# Patient Record
Sex: Male | Born: 1935 | Race: White | Hispanic: No | Marital: Married
Health system: Southern US, Community
[De-identification: ages and names within clinical notes are randomized; demographics above are authoritative.]

---

## 2019-06-26 ENCOUNTER — Other Ambulatory Visit (HOSPITAL_COMMUNITY): Payer: Medicare Other

## 2019-06-26 ENCOUNTER — Inpatient Hospital Stay
Admission: RE | Admit: 2019-06-26 | Discharge: 2019-07-27 | Disposition: A | Discharge status: Skilled Nursing Facility | Payer: Medicare Other | Attending: Internal Medicine | Admitting: Internal Medicine

## 2019-06-26 DIAGNOSIS — T85598A Other mechanical complication of other gastrointestinal prosthetic devices, implants and grafts, initial encounter: Secondary | ICD-10-CM

## 2019-06-26 DIAGNOSIS — Z931 Gastrostomy status: Secondary | ICD-10-CM

## 2019-06-26 DIAGNOSIS — Z4659 Encounter for fitting and adjustment of other gastrointestinal appliance and device: Secondary | ICD-10-CM

## 2019-06-26 DIAGNOSIS — Z431 Encounter for attention to gastrostomy: Secondary | ICD-10-CM

## 2019-06-27 LAB — CBC WITH DIFFERENTIAL/PLATELET
Abs Immature Granulocytes: 0.06 10*3/uL (ref 0.00–0.07)
Basophils Absolute: 0.1 10*3/uL (ref 0.0–0.1)
Basophils Relative: 1 %
Eosinophils Absolute: 0.3 10*3/uL (ref 0.0–0.5)
Eosinophils Relative: 3 %
HCT: 30.7 % — ABNORMAL LOW (ref 39.0–52.0)
Hemoglobin: 10.1 g/dL — ABNORMAL LOW (ref 13.0–17.0)
Immature Granulocytes: 1 %
Lymphocytes Relative: 18 %
Lymphs Abs: 1.4 10*3/uL (ref 0.7–4.0)
MCH: 29.5 pg (ref 26.0–34.0)
MCHC: 32.9 g/dL (ref 30.0–36.0)
MCV: 89.8 fL (ref 80.0–100.0)
Monocytes Absolute: 0.7 10*3/uL (ref 0.1–1.0)
Monocytes Relative: 9 %
Neutro Abs: 5.5 10*3/uL (ref 1.7–7.7)
Neutrophils Relative %: 68 %
Platelets: 428 10*3/uL — ABNORMAL HIGH (ref 150–400)
RBC: 3.42 MIL/uL — ABNORMAL LOW (ref 4.22–5.81)
RDW: 14.3 % (ref 11.5–15.5)
WBC: 8 10*3/uL (ref 4.0–10.5)
nRBC: 0 % (ref 0.0–0.2)

## 2019-06-27 LAB — COMPREHENSIVE METABOLIC PANEL
ALT: 65 U/L — ABNORMAL HIGH (ref 0–44)
AST: 28 U/L (ref 15–41)
Albumin: 2.6 g/dL — ABNORMAL LOW (ref 3.5–5.0)
Alkaline Phosphatase: 114 U/L (ref 38–126)
Anion gap: 8 (ref 5–15)
BUN: 22 mg/dL (ref 8–23)
CO2: 27 mmol/L (ref 22–32)
Calcium: 8.7 mg/dL — ABNORMAL LOW (ref 8.9–10.3)
Chloride: 100 mmol/L (ref 98–111)
Creatinine, Ser: 0.85 mg/dL (ref 0.61–1.24)
GFR calc Af Amer: >60 mL/min (ref >60)
GFR calc non Af Amer: >60 mL/min (ref >60)
Glucose, Bld: 180 mg/dL — ABNORMAL HIGH (ref 70–99)
Potassium: 4 mmol/L (ref 3.5–5.1)
Sodium: 135 mmol/L (ref 135–145)
Total Bilirubin: 0.5 mg/dL (ref 0.3–1.2)
Total Protein: 5.7 g/dL — ABNORMAL LOW (ref 6.5–8.1)

## 2019-06-27 LAB — PROTIME-INR
INR: 1.1 (ref 0.8–1.2)
Prothrombin Time: 14 seconds (ref 11.4–15.2)

## 2019-06-28 MED ORDER — FLECAINIDE ACETATE 50 MG PO TABS
50.00 | ORAL_TABLET | ORAL | Status: DC
Start: 2019-06-27 — End: 2019-06-28

## 2019-06-28 MED ORDER — METOPROLOL TARTRATE 25 MG PO TABS
25.00 | ORAL_TABLET | ORAL | Status: DC
Start: 2019-06-27 — End: 2019-06-28

## 2019-06-28 MED ORDER — AMLODIPINE BESYLATE 5 MG PO TABS
5.00 | ORAL_TABLET | ORAL | Status: DC
Start: 2019-06-27 — End: 2019-06-28

## 2019-06-28 MED ORDER — GENERIC EXTERNAL MEDICATION
Status: DC
Start: ? — End: 2019-06-28

## 2019-06-28 MED ORDER — ACETAMINOPHEN 500 MG PO TABS
1000.00 | ORAL_TABLET | ORAL | Status: DC
Start: ? — End: 2019-06-28

## 2019-06-28 MED ORDER — SODIUM CHLORIDE 1 G PO TABS
1000.00 | ORAL_TABLET | ORAL | Status: DC
Start: 2019-06-27 — End: 2019-06-28

## 2019-06-28 MED ORDER — MIRTAZAPINE 15 MG PO TABS
15.00 | ORAL_TABLET | ORAL | Status: DC
Start: 2019-06-27 — End: 2019-06-28

## 2019-06-28 MED ORDER — MAGNESIUM OXIDE 400 MG PO TABS
400.00 | ORAL_TABLET | ORAL | Status: DC
Start: 2019-06-27 — End: 2019-06-28

## 2019-06-28 MED ORDER — PRAVASTATIN SODIUM 40 MG PO TABS
40.00 | ORAL_TABLET | ORAL | Status: DC
Start: 2019-06-27 — End: 2019-06-28

## 2019-06-28 MED ORDER — FA-PYRIDOXINE-CYANOCOBALAMIN 2.5-25-2 MG PO TABS
1.00 | ORAL_TABLET | ORAL | Status: DC
Start: 2019-06-27 — End: 2019-06-28

## 2019-06-28 MED ORDER — HYDROXYCHLOROQUINE SULFATE 200 MG PO TABS
200.00 | ORAL_TABLET | ORAL | Status: DC
Start: 2019-06-27 — End: 2019-06-28

## 2019-06-28 MED ORDER — VENLAFAXINE HCL 37.5 MG PO TABS
37.50 | ORAL_TABLET | ORAL | Status: DC
Start: 2019-06-27 — End: 2019-06-28

## 2019-06-28 MED ORDER — HYDRALAZINE HCL 20 MG/ML IJ SOLN
10.00 | INTRAMUSCULAR | Status: DC
Start: ? — End: 2019-06-28

## 2019-06-28 MED ORDER — POLYSACCHARIDE IRON COMPLEX 150 MG PO CAPS
150.00 | ORAL_CAPSULE | ORAL | Status: DC
Start: 2019-06-27 — End: 2019-06-28

## 2019-06-28 MED ORDER — GLUCOSE 40 % PO GEL
15.00 | ORAL | Status: DC
Start: ? — End: 2019-06-28

## 2019-06-28 MED ORDER — INSULIN LISPRO 100 UNIT/ML ~~LOC~~ SOLN
3.00 | SUBCUTANEOUS | Status: DC
Start: 2019-06-27 — End: 2019-06-28

## 2019-06-28 MED ORDER — TERAZOSIN HCL 1 MG PO CAPS
1.00 | ORAL_CAPSULE | ORAL | Status: DC
Start: 2019-06-27 — End: 2019-06-28

## 2019-06-28 MED ORDER — LEVETIRACETAM 500 MG PO TABS
1500.00 | ORAL_TABLET | ORAL | Status: DC
Start: 2019-06-27 — End: 2019-06-28

## 2019-06-28 MED ORDER — INSULIN GLARGINE 100 UNIT/ML SOLOSTAR PEN
10.00 | PEN_INJECTOR | SUBCUTANEOUS | Status: DC
Start: 2019-06-27 — End: 2019-06-28

## 2019-06-28 MED ORDER — GABAPENTIN 100 MG PO CAPS
100.00 | ORAL_CAPSULE | ORAL | Status: DC
Start: 2019-06-27 — End: 2019-06-28

## 2019-06-28 MED ORDER — DEXTROSE 10 % IV SOLN
125.00 | INTRAVENOUS | Status: DC
Start: ? — End: 2019-06-28

## 2019-06-30 ENCOUNTER — Other Ambulatory Visit (HOSPITAL_COMMUNITY): Payer: Medicare Other

## 2019-06-30 LAB — CBC
HCT: 27.6 % — ABNORMAL LOW (ref 39.0–52.0)
Hemoglobin: 8.8 g/dL — ABNORMAL LOW (ref 13.0–17.0)
MCH: 29 pg (ref 26.0–34.0)
MCHC: 31.9 g/dL (ref 30.0–36.0)
MCV: 91.1 fL (ref 80.0–100.0)
Platelets: 358 10*3/uL (ref 150–400)
RBC: 3.03 MIL/uL — ABNORMAL LOW (ref 4.22–5.81)
RDW: 14.5 % (ref 11.5–15.5)
WBC: 7.6 10*3/uL (ref 4.0–10.5)
nRBC: 0 % (ref 0.0–0.2)

## 2019-07-01 LAB — URINALYSIS, ROUTINE W REFLEX MICROSCOPIC
Bacteria, UA: NONE SEEN
Bilirubin Urine: NEGATIVE
Glucose, UA: NEGATIVE mg/dL
Ketones, ur: NEGATIVE mg/dL
Nitrite: NEGATIVE
Protein, ur: 30 mg/dL — AB
RBC / HPF: >50 RBC/hpf — ABNORMAL HIGH (ref 0–5)
Specific Gravity, Urine: 1.018 (ref 1.005–1.030)
pH: 8 (ref 5.0–8.0)

## 2019-07-02 LAB — CBC
HCT: 30.2 % — ABNORMAL LOW (ref 39.0–52.0)
Hemoglobin: 9.4 g/dL — ABNORMAL LOW (ref 13.0–17.0)
MCH: 28.4 pg (ref 26.0–34.0)
MCHC: 31.1 g/dL (ref 30.0–36.0)
MCV: 91.2 fL (ref 80.0–100.0)
Platelets: 317 10*3/uL (ref 150–400)
RBC: 3.31 MIL/uL — ABNORMAL LOW (ref 4.22–5.81)
RDW: 14.1 % (ref 11.5–15.5)
WBC: 7.2 10*3/uL (ref 4.0–10.5)
nRBC: 0 % (ref 0.0–0.2)

## 2019-07-02 LAB — BASIC METABOLIC PANEL
Anion gap: 7 (ref 5–15)
BUN: 22 mg/dL (ref 8–23)
CO2: 31 mmol/L (ref 22–32)
Calcium: 8.8 mg/dL — ABNORMAL LOW (ref 8.9–10.3)
Chloride: 102 mmol/L (ref 98–111)
Creatinine, Ser: 0.79 mg/dL (ref 0.61–1.24)
GFR calc Af Amer: >60 mL/min (ref >60)
GFR calc non Af Amer: >60 mL/min (ref >60)
Glucose, Bld: 165 mg/dL — ABNORMAL HIGH (ref 70–99)
Potassium: 4 mmol/L (ref 3.5–5.1)
Sodium: 140 mmol/L (ref 135–145)

## 2019-07-03 LAB — URINE CULTURE: Culture: >=100000 — AB

## 2019-07-03 LAB — LEVETIRACETAM LEVEL: Levetiracetam Lvl: 24.8 ug/mL (ref 10.0–40.0)

## 2019-07-07 ENCOUNTER — Other Ambulatory Visit (HOSPITAL_COMMUNITY): Payer: Medicare Other

## 2019-07-07 MED ORDER — GENERIC EXTERNAL MEDICATION
Status: DC
Start: ? — End: 2019-07-07

## 2019-07-09 LAB — BASIC METABOLIC PANEL
Anion gap: 9 (ref 5–15)
BUN: 30 mg/dL — ABNORMAL HIGH (ref 8–23)
CO2: 29 mmol/L (ref 22–32)
Calcium: 9.2 mg/dL (ref 8.9–10.3)
Chloride: 110 mmol/L (ref 98–111)
Creatinine, Ser: 0.87 mg/dL (ref 0.61–1.24)
GFR calc Af Amer: >60 mL/min (ref >60)
GFR calc non Af Amer: >60 mL/min (ref >60)
Glucose, Bld: 175 mg/dL — ABNORMAL HIGH (ref 70–99)
Potassium: 3.8 mmol/L (ref 3.5–5.1)
Sodium: 148 mmol/L — ABNORMAL HIGH (ref 135–145)

## 2019-07-09 LAB — CBC
HCT: 31.1 % — ABNORMAL LOW (ref 39.0–52.0)
Hemoglobin: 9.4 g/dL — ABNORMAL LOW (ref 13.0–17.0)
MCH: 28.1 pg (ref 26.0–34.0)
MCHC: 30.2 g/dL (ref 30.0–36.0)
MCV: 93.1 fL (ref 80.0–100.0)
Platelets: 220 10*3/uL (ref 150–400)
RBC: 3.34 MIL/uL — ABNORMAL LOW (ref 4.22–5.81)
RDW: 14.2 % (ref 11.5–15.5)
WBC: 7.7 10*3/uL (ref 4.0–10.5)
nRBC: 0 % (ref 0.0–0.2)

## 2019-07-10 LAB — BASIC METABOLIC PANEL
Anion gap: 9 (ref 5–15)
BUN: 30 mg/dL — ABNORMAL HIGH (ref 8–23)
CO2: 28 mmol/L (ref 22–32)
Calcium: 8.9 mg/dL (ref 8.9–10.3)
Chloride: 107 mmol/L (ref 98–111)
Creatinine, Ser: 0.82 mg/dL (ref 0.61–1.24)
GFR calc Af Amer: >60 mL/min (ref >60)
GFR calc non Af Amer: >60 mL/min (ref >60)
Glucose, Bld: 159 mg/dL — ABNORMAL HIGH (ref 70–99)
Potassium: 3.9 mmol/L (ref 3.5–5.1)
Sodium: 144 mmol/L (ref 135–145)

## 2019-07-12 ENCOUNTER — Other Ambulatory Visit (HOSPITAL_COMMUNITY): Payer: Medicare Other

## 2019-07-12 LAB — BASIC METABOLIC PANEL
Anion gap: 9 (ref 5–15)
BUN: 22 mg/dL (ref 8–23)
CO2: 27 mmol/L (ref 22–32)
Calcium: 8.8 mg/dL — ABNORMAL LOW (ref 8.9–10.3)
Chloride: 105 mmol/L (ref 98–111)
Creatinine, Ser: 0.82 mg/dL (ref 0.61–1.24)
GFR calc Af Amer: >60 mL/min (ref >60)
GFR calc non Af Amer: >60 mL/min (ref >60)
Glucose, Bld: 109 mg/dL — ABNORMAL HIGH (ref 70–99)
Potassium: 3.4 mmol/L — ABNORMAL LOW (ref 3.5–5.1)
Sodium: 141 mmol/L (ref 135–145)

## 2019-07-12 LAB — MAGNESIUM: Magnesium: 1.9 mg/dL (ref 1.7–2.4)

## 2019-07-12 LAB — CBC
HCT: 27.3 % — ABNORMAL LOW (ref 39.0–52.0)
Hemoglobin: 8.5 g/dL — ABNORMAL LOW (ref 13.0–17.0)
MCH: 28.1 pg (ref 26.0–34.0)
MCHC: 31.1 g/dL (ref 30.0–36.0)
MCV: 90.4 fL (ref 80.0–100.0)
Platelets: 216 10*3/uL (ref 150–400)
RBC: 3.02 MIL/uL — ABNORMAL LOW (ref 4.22–5.81)
RDW: 14 % (ref 11.5–15.5)
WBC: 6.7 10*3/uL (ref 4.0–10.5)
nRBC: 0 % (ref 0.0–0.2)

## 2019-07-13 LAB — LEVETIRACETAM LEVEL: Levetiracetam Lvl: 45.2 ug/mL — ABNORMAL HIGH (ref 10.0–40.0)

## 2019-07-14 ENCOUNTER — Other Ambulatory Visit (HOSPITAL_COMMUNITY): Payer: Medicare Other

## 2019-07-14 LAB — POTASSIUM: Potassium: 3 mmol/L — ABNORMAL LOW (ref 3.5–5.1)

## 2019-07-14 MED ORDER — GENERIC EXTERNAL MEDICATION
Status: DC
Start: ? — End: 2019-07-14

## 2019-07-15 LAB — POTASSIUM: Potassium: 3.7 mmol/L (ref 3.5–5.1)

## 2019-07-17 LAB — SARS CORONAVIRUS 2 (TAT 6-24 HRS): SARS Coronavirus 2: NEGATIVE

## 2019-07-17 NOTE — Consult Note (Signed)
Chief Complaint: Patient was seen in consultation today for percutaneous gastric tube placement at the request of Dr Theora Gianotti   Supervising Physician: Ruel Favors  Patient Status: Select  History of Present Illness: Randy Wall. is a 85 y.o. male   CAD Afib-- was on coumadin  CVA SDH/Craniotomy--- Encephalopathy Dysphagia Protein calorie malnutrition Deconditioning Long term care  Request for percutaneous gastric tube placement  No past medical history on file.    Allergies: Patient has no allergy information on record.  Medications: Prior to Admission medications   Not on File     No family history on file.  Social History   Socioeconomic History   Marital status: Married    Spouse name: Not on file   Number of children: Not on file   Years of education: Not on file   Highest education level: Not on file  Occupational History   Not on file  Social Needs   Financial resource strain: Not on file   Food insecurity    Worry: Not on file    Inability: Not on file   Transportation needs    Medical: Not on file    Non-medical: Not on file  Tobacco Use   Smoking status: Not on file  Substance and Sexual Activity   Alcohol use: Not on file   Drug use: Not on file   Sexual activity: Not on file  Lifestyle   Physical activity    Days per week: Not on file    Minutes per session: Not on file   Stress: Not on file  Relationships   Social connections    Talks on phone: Not on file    Gets together: Not on file    Attends religious service: Not on file    Active member of club or organization: Not on file    Attends meetings of clubs or organizations: Not on file    Relationship status: Not on file  Other Topics Concern   Not on file  Social History Narrative   Not on file    Review of Systems: A 12 point ROS discussed and pertinent positives are indicated in the HPI above.  All other systems are  negative.   Vital Signs: There were no vitals taken for this visit.  Physical Exam Vitals signs reviewed.  Cardiovascular:     Rate and Rhythm: Normal rate. Rhythm irregular.  Pulmonary:     Breath sounds: Normal breath sounds.  Abdominal:     Palpations: Abdomen is soft.  Musculoskeletal:     Comments: Tries to communicate Can speak softly Answers some questions  Skin:    General: Skin is dry.  Neurological:     Comments: Can follow few commands  Psychiatric:     Comments: Daughter Romona via phone-- consented for procedure     Imaging: Ct Abdomen Wo Contrast  Result Date: 07/14/2019 CLINICAL DATA:  Preop planning for gastrostomy catheter EXAM: CT ABDOMEN WITHOUT CONTRAST TECHNIQUE: Multidetector CT imaging of the abdomen was performed following the standard protocol without IV contrast. COMPARISON:  None. FINDINGS: Lower chest: Minimal dependent atelectasis posteriorly at the lung bases. Extensive coronary calcifications. No significant pleural or pericardial effusion. Hepatobiliary: No focal liver abnormality is seen. No gallstones, gallbladder wall thickening, or biliary dilatation. Pancreas: Unremarkable. No pancreatic ductal dilatation or surrounding inflammatory changes. Spleen: Normal in size without focal abnormality. Adrenals/Urinary Tract: 1.8 cm nonspecific left adrenal nodule. 0.5 cm parenchymal calcification in the mid left kidney. No  hydronephrosis or urolithiasis. Stomach/Bowel: Stomach is nondistended. There is probable safe epigastric window for percutaneous gastrostomy catheter with gastric distention. Visualized portions of small bowel and colon are nondilated, unremarkable. Vascular/Lymphatic: Aortic Atherosclerosis (ICD10-170.0) without aneurysm. No abdominal or mesenteric adenopathy. Other: No ascites. No free air. Musculoskeletal: Anterior vertebral endplate spurring at multiple levels in the lower thoracic spine. Multilevel spondylitic change in the lumbar  spine. No fracture or worrisome bone lesion. IMPRESSION: 1. Probable safe epigastric window for percutaneous gastrostomy catheter placement with gastric distention. 2. 1.8 cm nonspecific left adrenal nodule, statistically most likely adenoma in the absence of a history of primary carcinoma. 3. Coronary calcifications. The severity of coronary artery disease and any potential stenosis cannot be assessed on this non-gated CT examination. Assessment for potential risk factor modification, dietary therapy or pharmacologic therapy may be warranted, if clinically indicated. Electronically Signed   By: Corlis Leak M.D.   On: 07/14/2019 07:16   Ct Head Wo Contrast  Result Date: 07/12/2019 CLINICAL DATA:  Subdural hemorrhage. History of craniectomy for left subdural hemorrhage. New onset seizures today. EXAM: CT HEAD WITHOUT CONTRAST TECHNIQUE: Contiguous axial images were obtained from the base of the skull through the vertex without intravenous contrast. COMPARISON:  No pertinent prior studies available for comparison. FINDINGS: Brain: Motion degraded examination. There is dural thickening deep to a left-sided cranioplasty without appreciable residual subdural hematoma. There is a small amount of acute intraventricular hemorrhage within the occipital horn of the right lateral ventricle (series 3, image 12). Cortical/subcortical infarct within the lateral left parietal lobe which is likely subacute. Associated subtle gyriform hyperdensity consistent with cortical laminar necrosis and/or petechial hemorrhage. More inferiorly within the left parietooccipital lobe MCA vascular territory there is a larger cortical/subcortical infarct which appears subacute. Associated gyriform hyperdensity may reflect cortical laminar necrosis and/or petechial hemorrhage. Mild generalized parenchymal atrophy. Patchy hypodensity within the cerebral white matter and brainstem consistent with chronic small vessel ischemic disease. No evidence  of intracranial mass. No midline shift. Vascular: No hyperdense vessel.  Atherosclerotic calcifications. Skull: Left-sided cranioplasty. Sinuses/Orbits: Visualized orbits demonstrate no acute abnormality. No significant paranasal sinus disease or mastoid effusion at the imaged levels. These results were called by telephone at the time of interpretation on 07/12/2019 at 1:07 pm to provider [facility] , who verbally acknowledged these results. IMPRESSION: 1. Subacute appearing infarcts within the left parietal and occipital lobes, MCA vascular territory. Gyriform hyperdensity associated with these infarcts may reflect cortical laminar necrosis and/or petechial hemorrhage. 2. Small volume acute intraventricular hemorrhage within the right occipital horn. 3. Sequela of previous left subdural hematoma evacuation. No significant residual subdural hematoma. 4. Generalized parenchymal atrophy and chronic small vessel ischemic disease. Electronically Signed   By: Jackey Loge DO   On: 07/12/2019 13:15   Dg Abd Portable 1v  Result Date: 07/07/2019 CLINICAL DATA:  Encounter for NG tube placement. 2 images taken. 1st image RN noticed that tube was coiled and asked [place] to wait while she adjusted it. RN changed tube type and re-inserted for image #2. Images labeled as 1 and 2 of 2 EXAM: PORTABLE ABDOMEN - 1 VIEW COMPARISON:  07/07/2019 at 2 p.m. FINDINGS: Initial image shows an enteric tube curl proximal stomach just below the GE junction. Subsequent image shows placement of a nasogastric tube, tip projecting in the distal stomach. IMPRESSION: Well-positioned nasogastric tube. Electronically Signed   By: Amie Portland M.D.   On: 07/07/2019 16:57   Dg Abd Portable 1v  Result Date: 07/07/2019 CLINICAL DATA:  Nasogastric tube placement EXAM: PORTABLE ABDOMEN - 1 VIEW COMPARISON:  06/30/2019 FINDINGS: A nasogastric tube is identified coursing below the diaphragm with distal tip and side port coiled within the proximal  stomach. Nonobstructive bowel gas pattern. IMPRESSION: 1. Nasogastric tube is coiled within the proximal stomach. 2. Unremarkable bowel gas pattern. Electronically Signed   By: Davina Poke M.D.   On: 07/07/2019 14:13   Dg Abd Portable 1v  Result Date: 06/30/2019 CLINICAL DATA:  Feeding tube adjustment. EXAM: PORTABLE ABDOMEN - 1 VIEW COMPARISON:  Abdominal x-ray from same day. FINDINGS: Interval advancement of the feeding tube, now with the tip in the distal second portion of the duodenum. Normal bowel gas pattern. No acute osseous abnormality. IMPRESSION: 1. Advancement of the feeding tube, now within the second portion of the duodenum. Electronically Signed   By: Titus Dubin M.D.   On: 06/30/2019 10:57   Dg Abd Portable 1v  Result Date: 06/30/2019 CLINICAL DATA:  Feeding tube placement. EXAM: PORTABLE ABDOMEN - 1 VIEW COMPARISON:  06/26/2019 FINDINGS: The feeding tube tip is in the fundal region of the stomach and should be advanced several cm. Stable scattered air throughout the small and large bowel without distension. IMPRESSION: Feeding tube tip is in the fundal region the stomach. Electronically Signed   By: Marijo Sanes M.D.   On: 06/30/2019 07:22   Dg Abd Portable 1v  Result Date: 06/26/2019 CLINICAL DATA:  NG tube placement EXAM: PORTABLE ABDOMEN - 1 VIEW COMPARISON:  None. FINDINGS: Esophageal tube tip overlies the second portion of duodenum. Gas pattern is nonobstructed IMPRESSION: Tip of the feeding tube overlies the second portion of the duodenum Electronically Signed   By: Donavan Foil M.D.   On: 06/26/2019 23:06    Labs:  CBC: Recent Labs    06/30/19 0553 07/02/19 0540 07/09/19 0907 07/12/19 1036  WBC 7.6 7.2 7.7 6.7  HGB 8.8* 9.4* 9.4* 8.5*  HCT 27.6* 30.2* 31.1* 27.3*  PLT 358 317 220 216    COAGS: Recent Labs    06/27/19 0524  INR 1.1    BMP: Recent Labs    07/02/19 0540 07/09/19 0907 07/10/19 0731 07/12/19 1036 07/14/19 1217 07/15/19 0434   NA 140 148* 144 141  --   --   K 4.0 3.8 3.9 3.4* 3.0* 3.7  CL 102 110 107 105  --   --   CO2 31 29 28 27   --   --   GLUCOSE 165* 175* 159* 109*  --   --   BUN 22 30* 30* 22  --   --   CALCIUM 8.8* 9.2 8.9 8.8*  --   --   CREATININE 0.79 0.87 0.82 0.82  --   --   GFRNONAA >60 >60 >60 >60  --   --   GFRAA >60 >60 >60 >60  --   --     LIVER FUNCTION TESTS: Recent Labs    06/27/19 0524  BILITOT 0.5  AST 28  ALT 65*  ALKPHOS 114  PROT 5.7*  ALBUMIN 2.6*    TUMOR MARKERS: No results for input(s): AFPTM, CEA, CA199, CHROMGRNA in the last 8760 hours.  Assessment and Plan:  CVA/SDH Craniotomy Deconditioning Malnutrition Need for long term care Scheduled now for percutaneous gastric tube placement Risks and benefits image guided gastrostomy tube placement was discussed with the patient's daughter Romona- via phone including, but not limited to the need for a barium enema during the procedure, bleeding, infection, peritonitis and/or damage to  adjacent structures.  All questions were answered, she is agreeable to proceed. Consent signed and in chart.   Thank you for this interesting consult.  I greatly enjoyed meeting Randy McGraw-HillLester Samaan Jr. and look forward to participating in their care.  A copy of this report was sent to the requesting provider on this date.  Electronically Signed: Robet LeuPamela A Lacreshia Bondarenko, PA-C 07/17/2019, 11:39 AM   I spent a total of 40 Minutes    in face to face in clinical consultation, greater than 50% of which was counseling/coordinating care for percutaneous gastric tube placement

## 2019-07-18 ENCOUNTER — Other Ambulatory Visit (HOSPITAL_COMMUNITY): Payer: Medicare Other

## 2019-07-18 ENCOUNTER — Encounter (HOSPITAL_COMMUNITY): Payer: Self-pay | Admitting: Interventional Radiology

## 2019-07-18 HISTORY — PX: IR GASTROSTOMY TUBE MOD SED: IMG625

## 2019-07-18 LAB — BASIC METABOLIC PANEL
Anion gap: 10 (ref 5–15)
BUN: 7 mg/dL — ABNORMAL LOW (ref 8–23)
CO2: 26 mmol/L (ref 22–32)
Calcium: 9.1 mg/dL (ref 8.9–10.3)
Chloride: 102 mmol/L (ref 98–111)
Creatinine, Ser: 0.74 mg/dL (ref 0.61–1.24)
GFR calc Af Amer: >60 mL/min (ref >60)
GFR calc non Af Amer: >60 mL/min (ref >60)
Glucose, Bld: 141 mg/dL — ABNORMAL HIGH (ref 70–99)
Potassium: 2.7 mmol/L — CL (ref 3.5–5.1)
Sodium: 138 mmol/L (ref 135–145)

## 2019-07-18 LAB — CBC
HCT: 30.8 % — ABNORMAL LOW (ref 39.0–52.0)
Hemoglobin: 9.9 g/dL — ABNORMAL LOW (ref 13.0–17.0)
MCH: 28.4 pg (ref 26.0–34.0)
MCHC: 32.1 g/dL (ref 30.0–36.0)
MCV: 88.3 fL (ref 80.0–100.0)
Platelets: 258 10*3/uL (ref 150–400)
RBC: 3.49 MIL/uL — ABNORMAL LOW (ref 4.22–5.81)
RDW: 14.2 % (ref 11.5–15.5)
WBC: 5.3 10*3/uL (ref 4.0–10.5)
nRBC: 0 % (ref 0.0–0.2)

## 2019-07-18 LAB — PROTIME-INR
INR: 1.1 (ref 0.8–1.2)
Prothrombin Time: 14.5 seconds (ref 11.4–15.2)

## 2019-07-18 LAB — POTASSIUM: Potassium: 3.3 mmol/L — ABNORMAL LOW (ref 3.5–5.1)

## 2019-07-18 MED ORDER — GLUCAGON HCL RDNA (DIAGNOSTIC) 1 MG IJ SOLR
INTRAMUSCULAR | Status: AC | PRN
  Administered 2019-07-18: .5 mg via INTRAVENOUS

## 2019-07-18 MED ORDER — CEFAZOLIN SODIUM-DEXTROSE 2-4 GM/100ML-% IV SOLN
2.0000 g | Freq: Once | INTRAVENOUS | Status: AC
  Administered 2019-07-18: 2 g via INTRAVENOUS

## 2019-07-18 MED ORDER — LIDOCAINE HCL 1 % IJ SOLN
INTRAMUSCULAR | Status: AC | PRN
  Administered 2019-07-18: 5 mL

## 2019-07-18 MED ORDER — MIDAZOLAM HCL 2 MG/2ML IJ SOLN
INTRAMUSCULAR | Status: AC | PRN
  Administered 2019-07-18: 1 mg via INTRAVENOUS

## 2019-07-18 MED ORDER — FENTANYL CITRATE (PF) 100 MCG/2ML IJ SOLN
INTRAMUSCULAR | Status: AC
  Filled 2019-07-18: qty 2

## 2019-07-18 MED ORDER — IOHEXOL 300 MG/ML  SOLN
50.0000 mL | Freq: Once | INTRAMUSCULAR | Status: AC | PRN
  Administered 2019-07-18: 13:00:00 10 mL

## 2019-07-18 MED ORDER — GLUCAGON HCL RDNA (DIAGNOSTIC) 1 MG IJ SOLR
INTRAMUSCULAR | Status: AC
  Filled 2019-07-18: qty 1

## 2019-07-18 MED ORDER — MIDAZOLAM HCL 2 MG/2ML IJ SOLN
INTRAMUSCULAR | Status: AC
  Filled 2019-07-18: qty 2

## 2019-07-18 MED ORDER — FENTANYL CITRATE (PF) 100 MCG/2ML IJ SOLN
INTRAMUSCULAR | Status: AC | PRN
  Administered 2019-07-18: 50 ug via INTRAVENOUS

## 2019-07-18 MED ORDER — LIDOCAINE HCL 1 % IJ SOLN
INTRAMUSCULAR | Status: AC
  Filled 2019-07-18: qty 20

## 2019-07-18 NOTE — Procedures (Signed)
  Procedure: Perc gastrostomy catheter placement   EBL:   minimal Complications:  none immediate  See full dictation in [facility].  Dillard Cannon MD Main # [phone] Pager  [phone]

## 2019-07-19 NOTE — Progress Notes (Signed)
Referring Physician(s): Owens Shark  Supervising Physician: Sandi Mariscal  Patient Status:  [facility] - In-pt  Chief Complaint:  Protein calorie malnutrition  Subjective:  Patient resting comfortably  Allergies: Patient has no allergy information on record.  Medications: Prior to Admission medications   Not on File     Vital Signs: BP (!) 172/74 (BP Location: Right Arm)   Pulse 82   Resp 20   SpO2 97%   Physical Exam NAD Gtube in place Site looks good No bleeding or leakage  Imaging: Ir Gastrostomy Tube Mod Sed  Result Date: 07/18/2019 CLINICAL DATA:  Previous stroke, dysphagia, needs enteral feeding support EXAM: PERC PLACEMENT GASTROSTOMY FLUOROSCOPY TIME:  1.6 minute; 158  uGym2 DAP TECHNIQUE: The procedure, risks, benefits, and alternatives were explained to the family. Questions regarding the procedure were encouraged and answered. The family understands and consents to the procedure. As antibiotic prophylaxis, cefazolin 2 g was ordered pre-procedure and administered intravenously within one hour of incision. Appropriate percutaneous entry window was confirmed on recent CT. A 5 French angiographic catheter was placed as orogastric tube. The upper abdomen was prepped with Betadine, draped in usual sterile fashion, and infiltrated locally with 1% lidocaine. Intravenous Fentanyl 27mcg and Versed 1mg  were administered as conscious sedation during continuous monitoring of the patient's level of consciousness and physiological / cardiorespiratory status by the radiology RN, with a total moderate sedation time of 10 minutes. 0.5 mg glucagon administered IV to facilitate gastric distention. Stomach was insufflated using air through the orogastric tube. An 12 French sheath needle was advanced percutaneously into the gastric lumen under fluoroscopy. Gas could be aspirated and a small contrast injection confirmed intraluminal spread. The sheath was exchanged over a guidewire for a 9 [place]  vascular sheath, through which the snare device was advanced and used to snare a guidewire passed through the orogastric tube. This was withdrawn, and the snare attached to the 20 French pull-through gastrostomy tube, which was advanced antegrade, positioned with the internal bumper securing the anterior gastric wall to the anterior abdominal wall. Small contrast injection confirms appropriate positioning. The external bumper was applied and the catheter was flushed. COMPLICATIONS: COMPLICATIONS none IMPRESSION: 1. Technically successful 20 French pull-through gastrostomy placement under fluoroscopy. Electronically Signed   By: Lucrezia Europe M.D.   On: 07/18/2019 13:45    Labs:  CBC: Recent Labs    07/02/19 0540 07/09/19 0907 07/12/19 1036 07/18/19 0435  WBC 7.2 7.7 6.7 5.3  HGB 9.4* 9.4* 8.5* 9.9*  HCT 30.2* 31.1* 27.3* 30.8*  PLT 317 220 216 258    COAGS: Recent Labs    06/27/19 0524 07/18/19 0435  INR 1.1 1.1    BMP: Recent Labs    07/09/19 0907 07/10/19 0731 07/12/19 1036 07/14/19 1217 07/15/19 0434 07/18/19 0435 07/18/19 1658  NA 148* 144 141  --   --  138  --   K 3.8 3.9 3.4* 3.0* 3.7 2.7* 3.3*  CL 110 107 105  --   --  102  --   CO2 29 28 27   --   --  26  --   GLUCOSE 175* 159* 109*  --   --  141*  --   BUN 30* 30* 22  --   --  7*  --   CALCIUM 9.2 8.9 8.8*  --   --  9.1  --   CREATININE 0.87 0.82 0.82  --   --  0.74  --   GFRNONAA >60 >60 >60  --   --  >  60  --   GFRAA >60 >60 >60  --   --  >60  --     LIVER FUNCTION TESTS: Recent Labs    06/27/19 0524  BILITOT 0.5  AST 28  ALT 65*  ALKPHOS 114  PROT 5.7*  ALBUMIN 2.6*    Assessment and Plan:  Protein calorie malnutrition  S/P gastrostomy tube placement yesterday by Dr. Deanne Coffer.  Ok to use Gtube now for feeds and medications.  Routine Gtube care.  Electronically Signed: Gwynneth Macleod, PA-C 07/19/2019, 9:01 AM    I spent a total of 15 Minutes at the the patient's bedside AND on the  patient's hospital floor or unit, greater than 50% of which was counseling/coordinating care for post procedure Gtube check.

## 2019-07-21 LAB — CBC
HCT: 28.2 % — ABNORMAL LOW (ref 39.0–52.0)
Hemoglobin: 8.9 g/dL — ABNORMAL LOW (ref 13.0–17.0)
MCH: 28.2 pg (ref 26.0–34.0)
MCHC: 31.6 g/dL (ref 30.0–36.0)
MCV: 89.2 fL (ref 80.0–100.0)
Platelets: 218 10*3/uL (ref 150–400)
RBC: 3.16 MIL/uL — ABNORMAL LOW (ref 4.22–5.81)
RDW: 14.3 % (ref 11.5–15.5)
WBC: 4.1 10*3/uL (ref 4.0–10.5)
nRBC: 0 % (ref 0.0–0.2)

## 2019-07-21 LAB — AMMONIA: Ammonia: 14 umol/L (ref 9–35)

## 2019-07-21 LAB — URINALYSIS, ROUTINE W REFLEX MICROSCOPIC
Bilirubin Urine: NEGATIVE
Glucose, UA: NEGATIVE mg/dL
Hgb urine dipstick: NEGATIVE
Ketones, ur: NEGATIVE mg/dL
Nitrite: NEGATIVE
Protein, ur: 30 mg/dL — AB
Specific Gravity, Urine: 1.018 (ref 1.005–1.030)
pH: 8 (ref 5.0–8.0)

## 2019-07-21 LAB — BASIC METABOLIC PANEL
Anion gap: 10 (ref 5–15)
BUN: 16 mg/dL (ref 8–23)
CO2: 25 mmol/L (ref 22–32)
Calcium: 8 mg/dL — ABNORMAL LOW (ref 8.9–10.3)
Chloride: 99 mmol/L (ref 98–111)
Creatinine, Ser: 0.57 mg/dL — ABNORMAL LOW (ref 0.61–1.24)
GFR calc Af Amer: >60 mL/min (ref >60)
GFR calc non Af Amer: >60 mL/min (ref >60)
Glucose, Bld: 132 mg/dL — ABNORMAL HIGH (ref 70–99)
Potassium: 3.2 mmol/L — ABNORMAL LOW (ref 3.5–5.1)
Sodium: 134 mmol/L — ABNORMAL LOW (ref 135–145)

## 2019-07-22 ENCOUNTER — Other Ambulatory Visit (HOSPITAL_COMMUNITY): Payer: Medicare Other

## 2019-07-22 LAB — POTASSIUM: Potassium: 3.6 mmol/L (ref 3.5–5.1)

## 2019-07-23 LAB — URINE CULTURE

## 2019-07-24 LAB — BASIC METABOLIC PANEL
Anion gap: 10 (ref 5–15)
BUN: 17 mg/dL (ref 8–23)
CO2: 27 mmol/L (ref 22–32)
Calcium: 8.6 mg/dL — ABNORMAL LOW (ref 8.9–10.3)
Chloride: 100 mmol/L (ref 98–111)
Creatinine, Ser: 0.64 mg/dL (ref 0.61–1.24)
GFR calc Af Amer: >60 mL/min (ref >60)
GFR calc non Af Amer: >60 mL/min (ref >60)
Glucose, Bld: 138 mg/dL — ABNORMAL HIGH (ref 70–99)
Potassium: 3.9 mmol/L (ref 3.5–5.1)
Sodium: 137 mmol/L (ref 135–145)

## 2019-07-24 LAB — CBC
HCT: 28.8 % — ABNORMAL LOW (ref 39.0–52.0)
Hemoglobin: 9.2 g/dL — ABNORMAL LOW (ref 13.0–17.0)
MCH: 28.4 pg (ref 26.0–34.0)
MCHC: 31.9 g/dL (ref 30.0–36.0)
MCV: 88.9 fL (ref 80.0–100.0)
Platelets: 291 10*3/uL (ref 150–400)
RBC: 3.24 MIL/uL — ABNORMAL LOW (ref 4.22–5.81)
RDW: 14.3 % (ref 11.5–15.5)
WBC: 3.8 10*3/uL — ABNORMAL LOW (ref 4.0–10.5)
nRBC: 0 % (ref 0.0–0.2)

## 2019-07-24 LAB — URINE CULTURE

## 2019-07-24 LAB — MAGNESIUM: Magnesium: 1.8 mg/dL (ref 1.7–2.4)

## 2019-07-26 ENCOUNTER — Inpatient Hospital Stay (HOSPITAL_COMMUNITY)
Admission: RE | Admit: 2019-07-26 | Discharge: 2019-07-26 | Disposition: A | Discharge status: Home / Self Care | Payer: Medicare Other | Source: Home / Self Care | Attending: Internal Medicine | Admitting: Internal Medicine

## 2019-07-26 DIAGNOSIS — R569 Unspecified convulsions: Secondary | ICD-10-CM

## 2019-07-26 LAB — NOVEL CORONAVIRUS, NAA (HOSP ORDER, SEND-OUT TO REF LAB; TAT 18-24 HRS): SARS-CoV-2, NAA: NOT DETECTED

## 2019-07-26 NOTE — Procedures (Addendum)
Patient Name: Randy Wall.  MRN: [id number]  Epilepsy Attending: Lora Havens  Referring Physician/Provider: Dr. Satira Sark Date: 07/26/2019 Duration: 23.15 minutes  Patient history: 85 year old male with left parieto-occipital infarct status post left parietal crani and right occipital horn intraventricular hemorrhage.  EEG to evaluate for seizures.  Level of alertness: Awake  AEDs during EEG study: Keppra  Technical aspects: This EEG study was done with scalp electrodes positioned according to the 10-20 International system of electrode placement. Electrical activity was acquired at a sampling rate of 500Hz  and reviewed with a high frequency filter of 70Hz  and a low frequency filter of 1Hz . EEG data were recorded continuously and digitally stored.   Description: During awake state, the posterior dominant rhythm consists of 7-8 Hz activity of moderate voltage (25-35 uV) seen predominantly in right posterior head regions, asymmetric (decreased left) and reactive to eye opening and eye closing.  There was continuous generalized and lateralized left 2 to 5 Hz theta-delta slowing.  Frequent broad sharp waves were seen in left hemisphere, maximal left temporal.  At times, these sharp waves appeared rhythmic without clear evolution.  Hyperventilation and photic stimulation were not performed.         Abnormality -Continuous slow, generalized and lateralized left -Sharp waves, left hemisphere, maximal left temporal   IMPRESSION: This study showed evidence of epileptogenicity and cortical dysfunction in left hemisphere likely secondary to underlying encephalomalacia.  At times the sharp waves become rhythmic without clear evolution and are on the ictal-interictal continuum.  Clinical correlation is indicated.  I called and updated Dr. Satira Sark regarding EEG findings.     Yogi Arther Barbra Sarks

## 2019-07-26 NOTE — Progress Notes (Signed)
EEG completed, results pending. 

## 2019-07-27 LAB — URINE CULTURE: Culture: >=100000 — AB

## 2021-03-07 DEATH — deceased

## 2021-07-16 IMAGING — CT CT ABDOMEN W/O CM
2 of 4 series · 15 of 46 positions shown, 17 images · non-contrast
Comparison: None.

CLINICAL DATA: Preop planning for gastrostomy catheter

EXAM:
CT ABDOMEN WITHOUT CONTRAST
TECHNIQUE: Multidetector CT imaging of the abdomen was performed following the
standard protocol without IV contrast.

[Series 3: ap without · axial · non-contrast · 0.84mm/px · z∈[+939,+1194]mm · 12 of 59 slices shown, 14 images]
[im 4/59  soft-tissue]
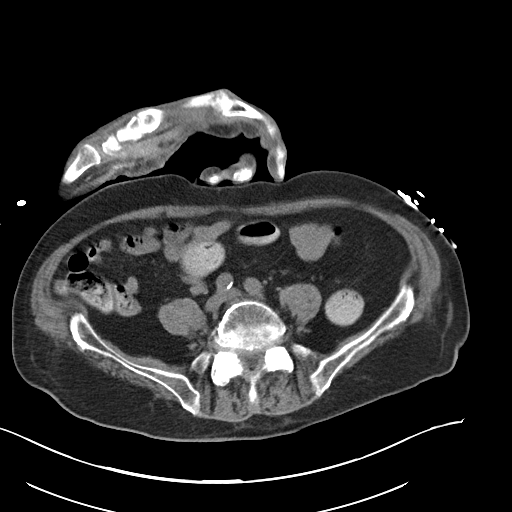
[im 4/59  bone]
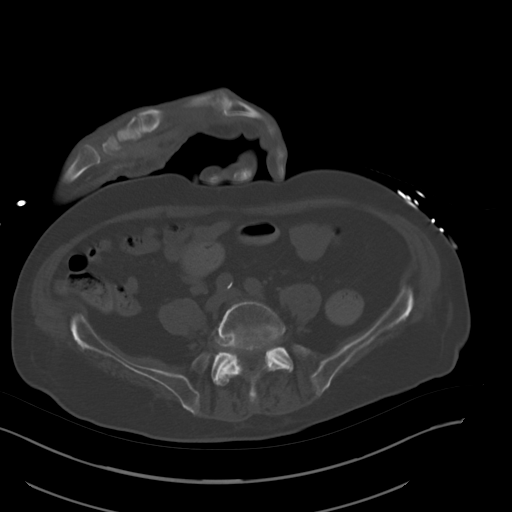
[im 8/59  soft-tissue]
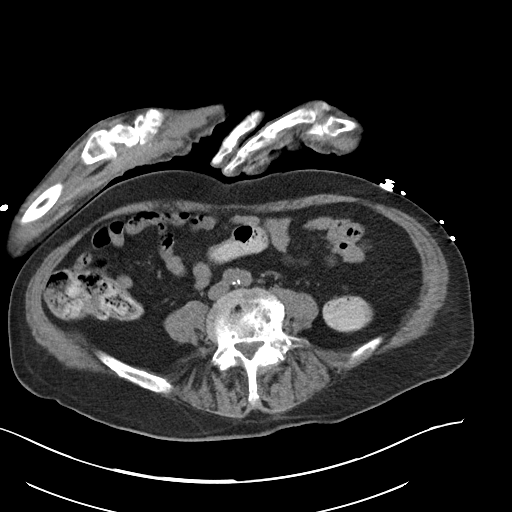
[im 12/59  soft-tissue]
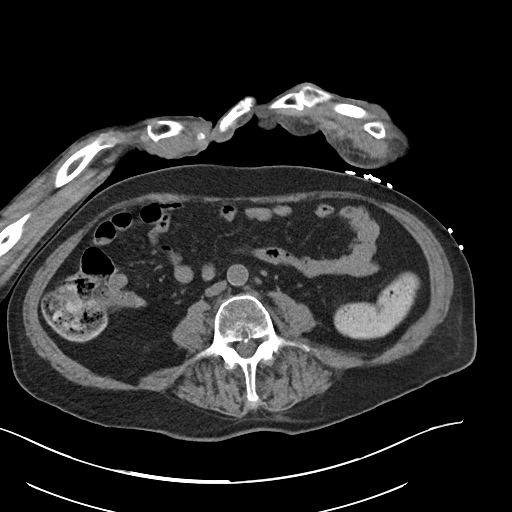
[im 20/59  soft-tissue]
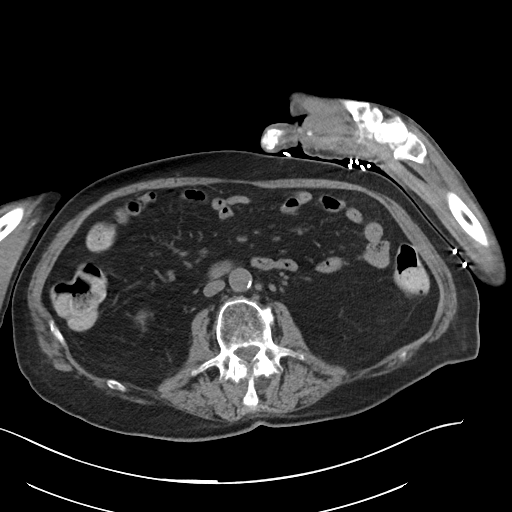
[im 24/59  soft-tissue]
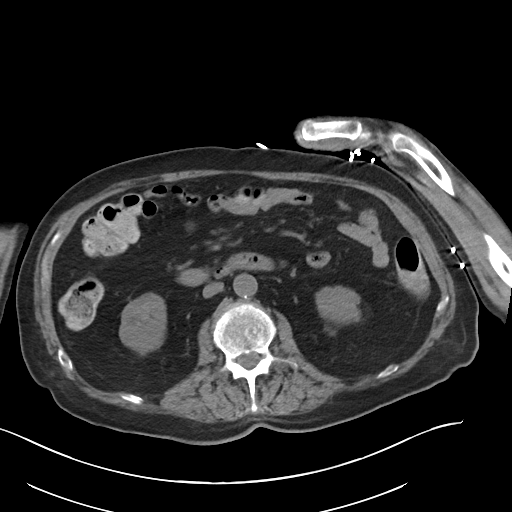
[im 28/59  soft-tissue]
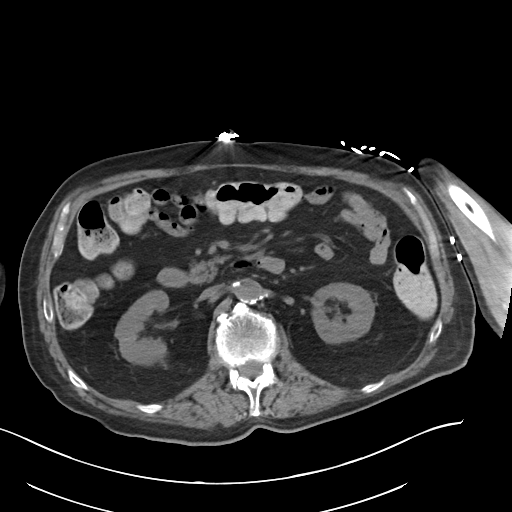
[im 31/59  soft-tissue]
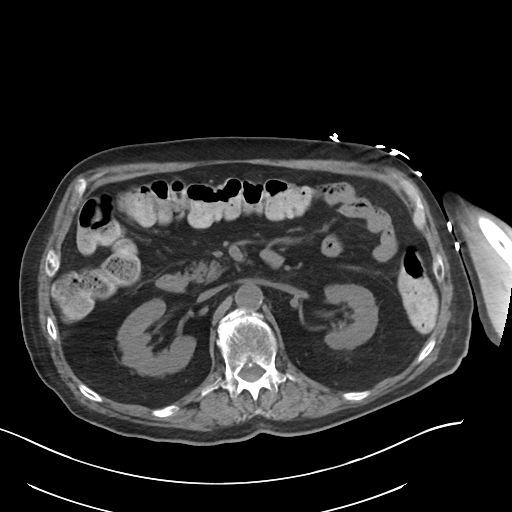
[im 35/59  soft-tissue]
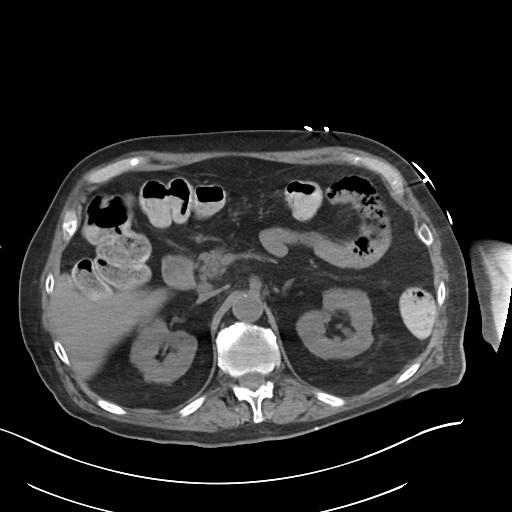
[im 39/59  soft-tissue]
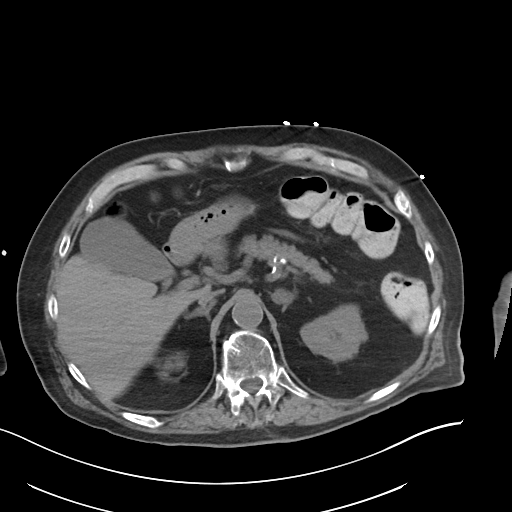
[im 39/59  bone]
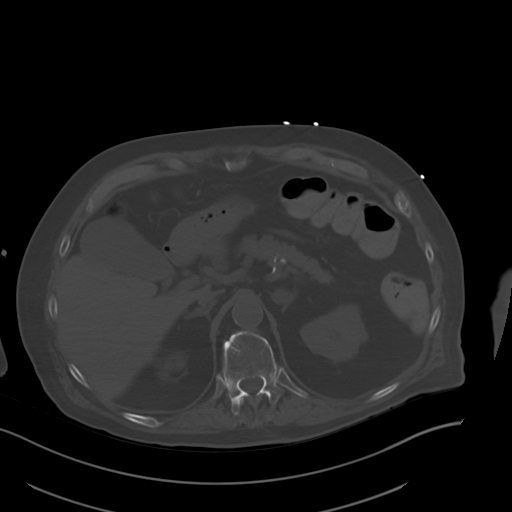
[im 47/59  soft-tissue]
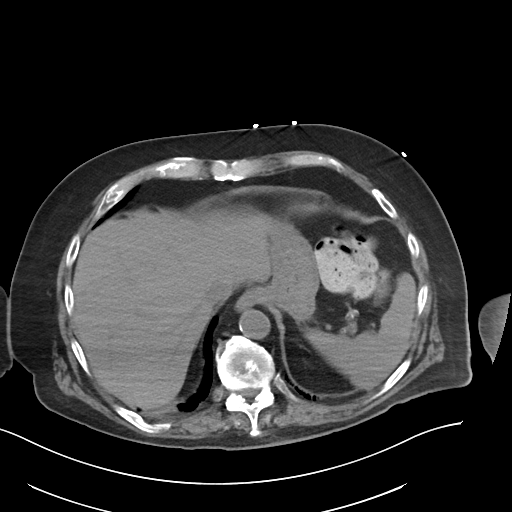
[im 51/59  soft-tissue]
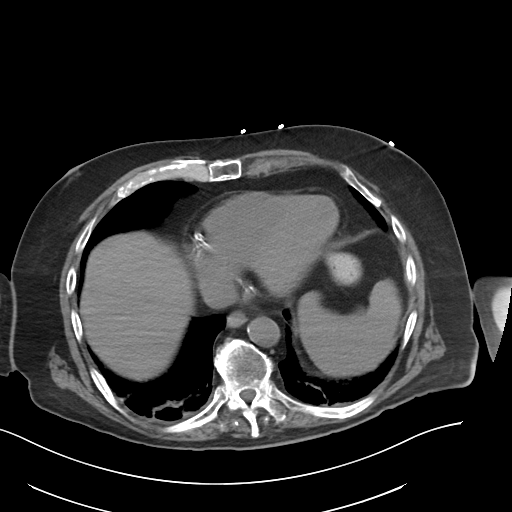
[im 55/59  soft-tissue]
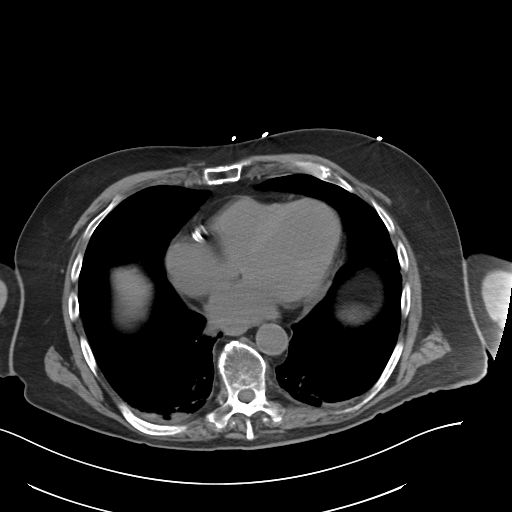

[Series 6: cor · coronal · 0.60mm/px · 3 of 98 slices shown]
[im 33/98  soft-tissue]
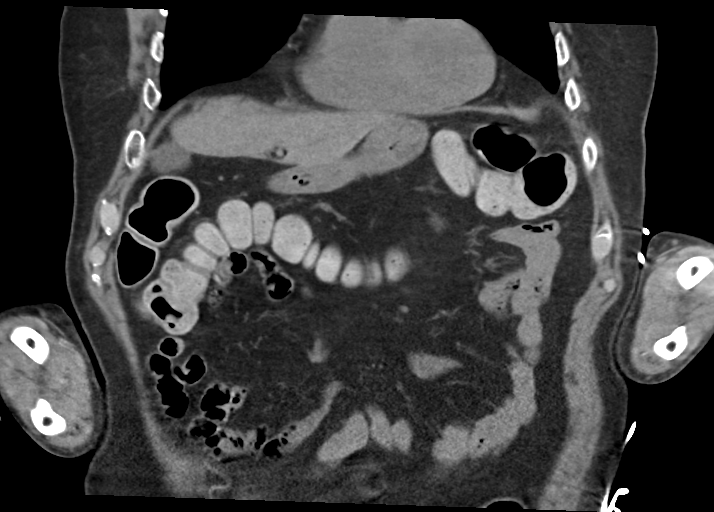
[im 44/98  soft-tissue]
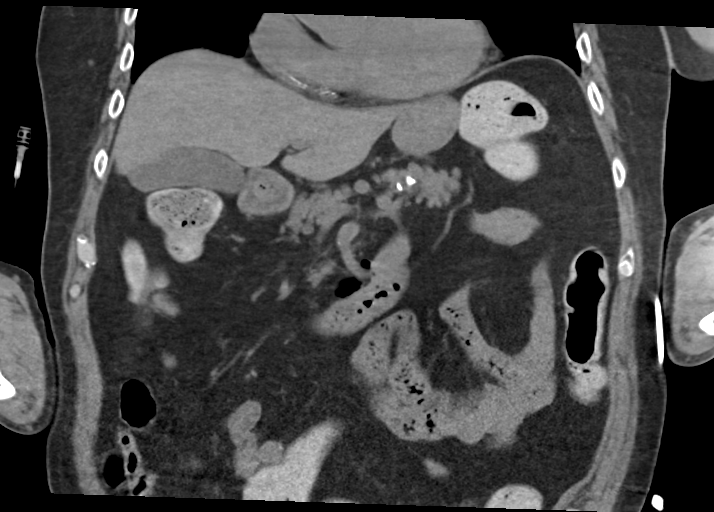
[im 54/98  soft-tissue]
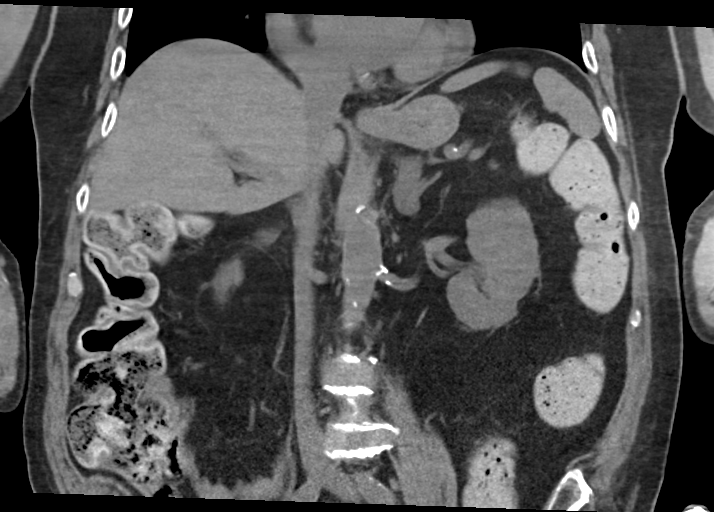

[15 of 46 positions shown; findings below may reference images not displayed]

FINDINGS: Lower chest: Minimal dependent atelectasis posteriorly at the lung
bases. Extensive coronary calcifications. No significant pleural or
pericardial effusion.

Hepatobiliary: No focal liver abnormality is seen. No gallstones,
gallbladder wall thickening, or biliary dilatation.

Pancreas: Unremarkable. No pancreatic ductal dilatation or
surrounding inflammatory changes.

Spleen: Normal in size without focal abnormality.

Adrenals/Urinary Tract: 1.8 cm nonspecific left adrenal nodule.
cm parenchymal calcification in the mid left kidney. No
hydronephrosis or urolithiasis.

Stomach/Bowel: Stomach is nondistended. There is probable safe
epigastric window for percutaneous gastrostomy catheter with gastric
distention. Visualized portions of small bowel and colon are
nondilated, unremarkable.

Vascular/Lymphatic: Aortic Atherosclerosis (PYUBF-170.0) without
aneurysm. No abdominal or mesenteric adenopathy.

Other: No ascites. No free air.

Musculoskeletal: Anterior vertebral endplate spurring at multiple
levels in the lower thoracic spine. Multilevel spondylitic change in
the lumbar spine. No fracture or worrisome bone lesion.
IMPRESSION: 1. Probable safe epigastric window for percutaneous gastrostomy
catheter placement with gastric distention.
2. 1.8 cm nonspecific left adrenal nodule, statistically most likely
adenoma in the absence of a history of primary carcinoma.
3. Coronary calcifications. The severity of coronary artery disease
and any potential stenosis cannot be assessed on this non-gated CT
examination. Assessment for potential risk factor modification,
dietary therapy or pharmacologic therapy may be warranted, if
clinically indicated.

## 2021-07-24 IMAGING — CT CT HEAD W/O CM
4 of 5 series · 17 of 47 positions shown, 18 images · non-contrast
Comparison: 07/12/2019

CLINICAL DATA: Follow-up hemorrhage

EXAM:
CT HEAD WITHOUT CONTRAST
TECHNIQUE: Contiguous axial images were obtained from the base of the skull
through the vertex without intravenous contrast.

[Series 3: head bone · axial · 0.45mm/px · z∈[-160,-8]mm · 8 of 92 slices shown]
[im 8/92  bone]
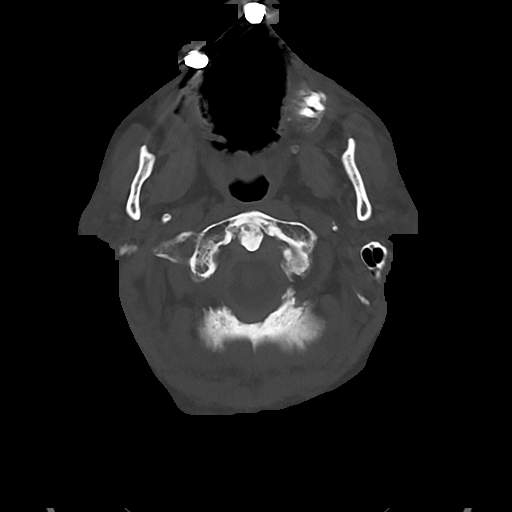
[im 23/92  bone]
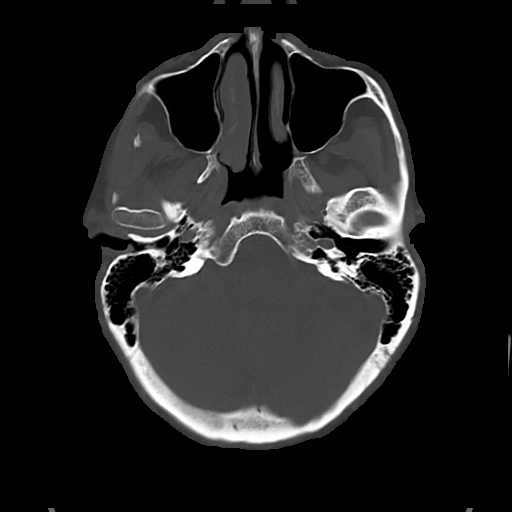
[im 31/92  bone]
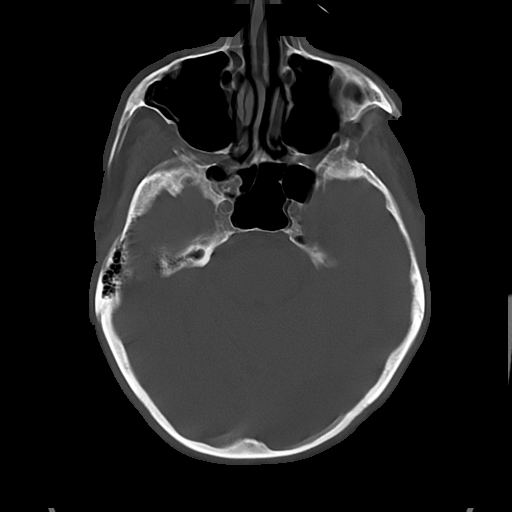
[im 38/92  bone]
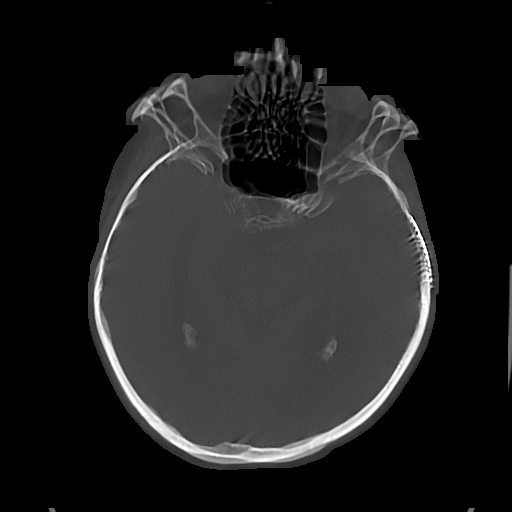
[im 54/92  bone]
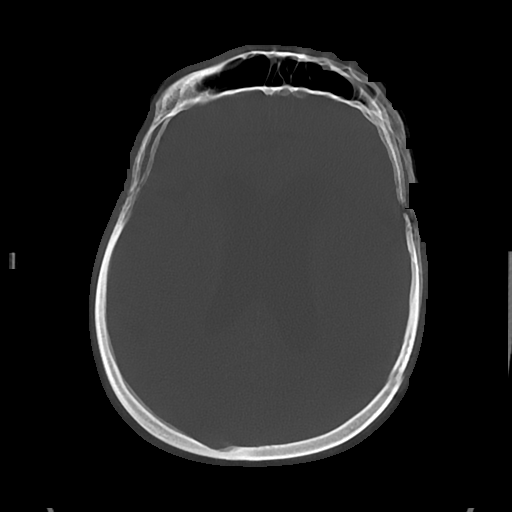
[im 61/92  bone]
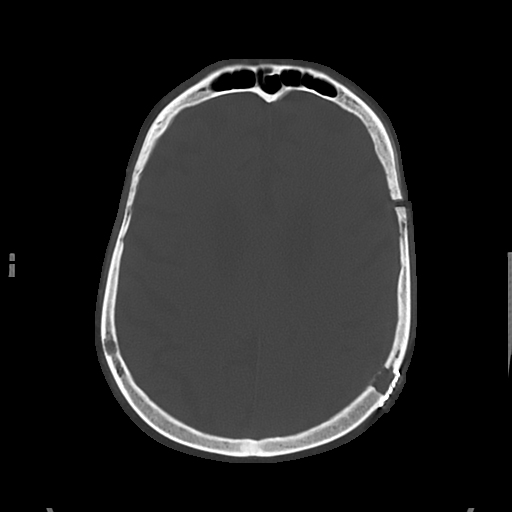
[im 69/92  bone]
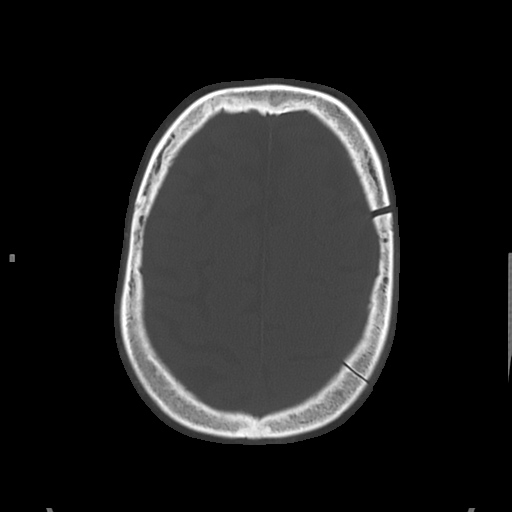
[im 84/92  bone]
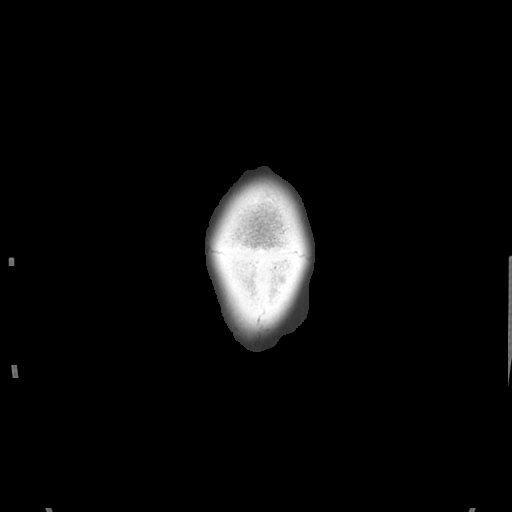

[Series 5: head wo · axial · 0.45mm/px · z∈[-134,-54]mm · 3 of 34 slices shown, 4 images]
[im 9/34  brain]
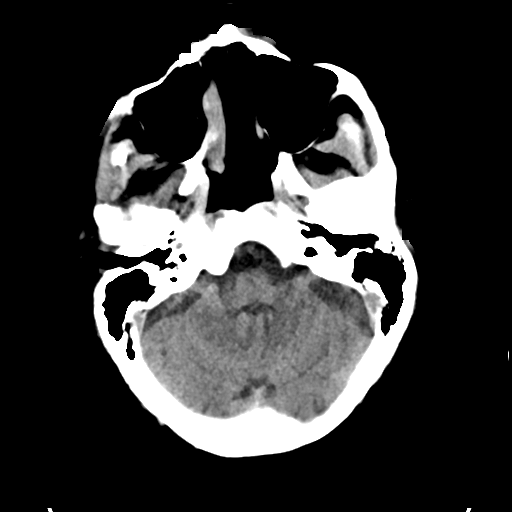
[im 9/34  bone]
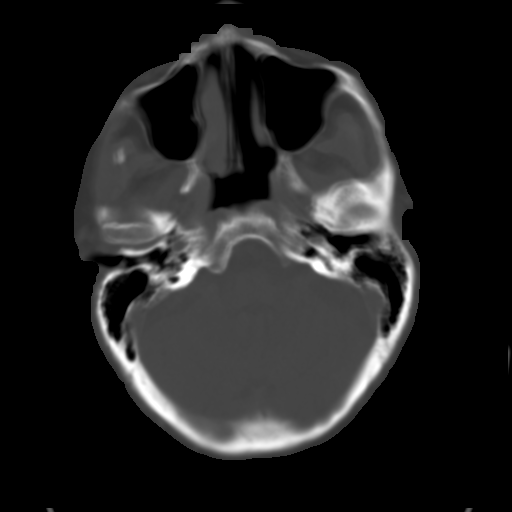
[im 17/34  brain]
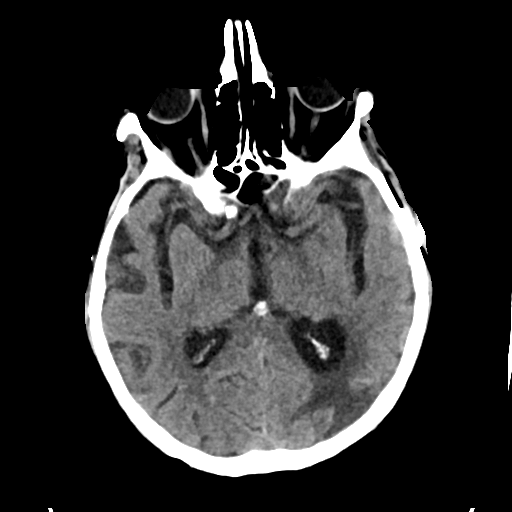
[im 25/34  brain]
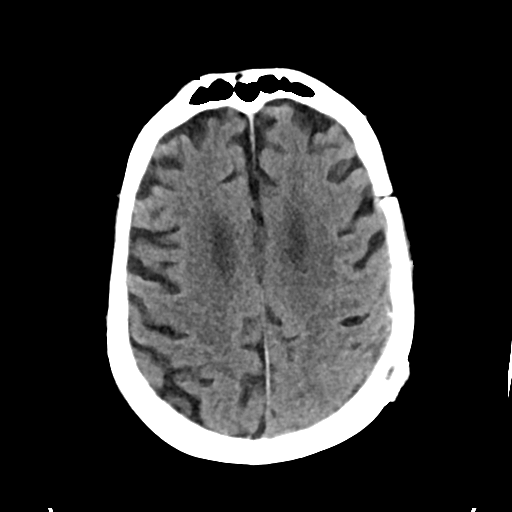

[Series 7: cor soft · coronal · 0.37mm/px · 3 of 72 slices shown]
[im 24/72  brain]
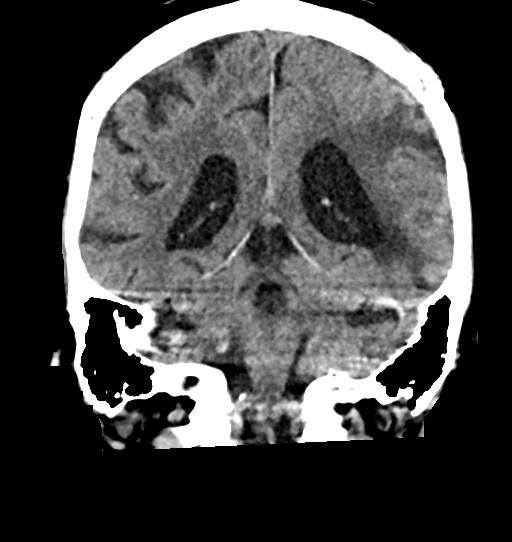
[im 32/72  brain]
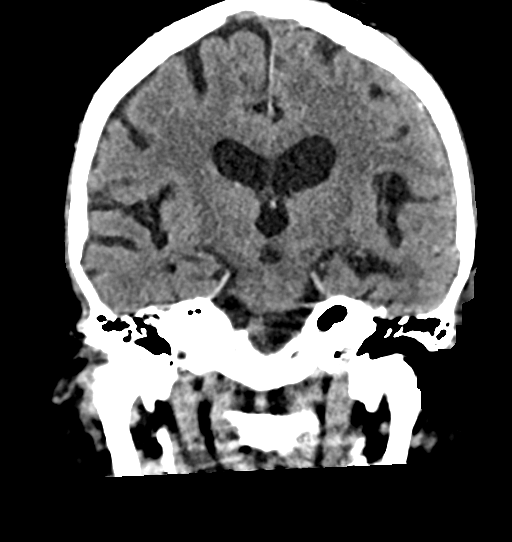
[im 40/72  brain]
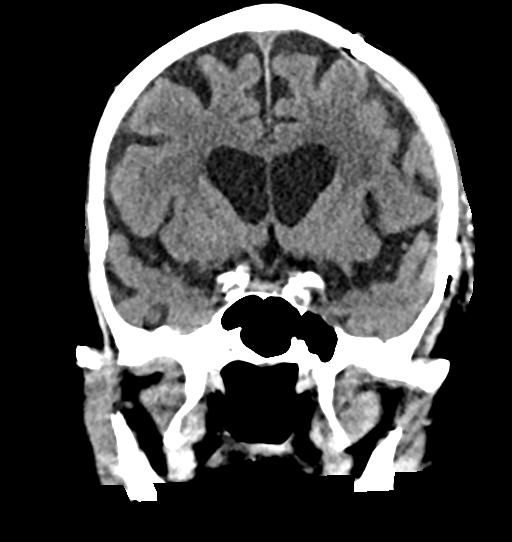

[Series 8: sag soft · sagittal · 0.39mm/px · 3 of 64 slices shown]
[im 22/64  brain]
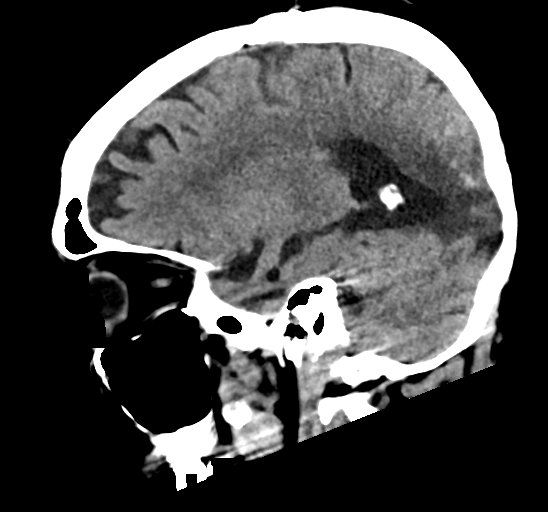
[im 32/64  brain]
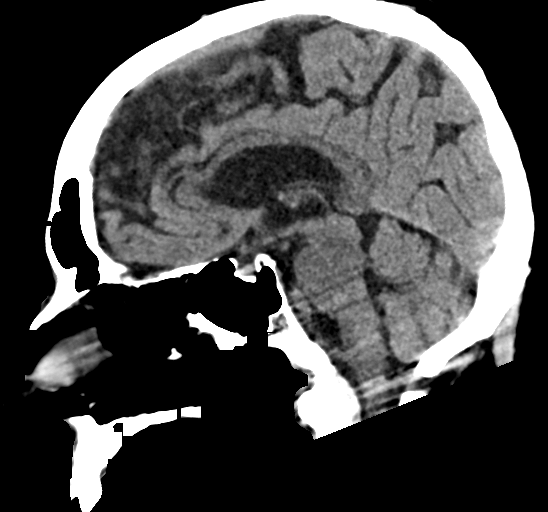
[im 43/64  brain]
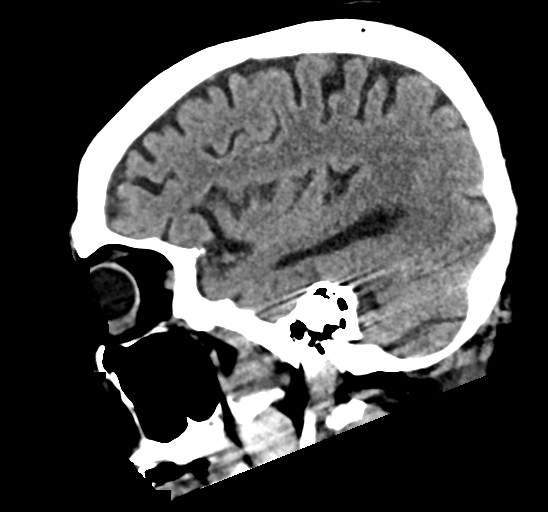

[17 of 47 positions shown; findings below may reference images not displayed]

FINDINGS: Brain: Areas of decreased attenuation are noted in the left
parietooccipital region consistent with prior infarct. Stable
hyperdensity cortex is noted unchanged from the prior exam. No new
focal infarct is seen. Mild atrophic changes are noted. Minimal
residual right occipital horn intraventricular hemorrhage is noted.
No new hemorrhage is seen.

Vascular: No hyperdense vessel or unexpected calcification.

Skull: Postsurgical changes are noted in the left parietal region.
Underlying dural thickening is noted.

Sinuses/Orbits: No acute finding.

Other: None.
IMPRESSION: Stable changes of left parietooccipital infarct

Stable right occipital horn intraventricular hemorrhage. No new
hemorrhage is seen.

Postsurgical changes stable in appearance from the prior exam.
